# Patient Record
Sex: Female | Born: 1935 | Race: White | Hispanic: No | Marital: Married | Smoking: Never smoker
Health system: Southern US, Community
[De-identification: ages and names within clinical notes are randomized; demographics above are authoritative.]

---

## 2005-01-23 ENCOUNTER — Ambulatory Visit: Payer: Self-pay | Admitting: Internal Medicine

## 2005-04-11 ENCOUNTER — Ambulatory Visit: Payer: Self-pay | Admitting: Unknown Physician Specialty

## 2006-02-06 ENCOUNTER — Ambulatory Visit: Payer: Self-pay | Admitting: Internal Medicine

## 2006-02-13 ENCOUNTER — Ambulatory Visit: Payer: Self-pay | Admitting: Ophthalmology

## 2006-02-17 ENCOUNTER — Ambulatory Visit: Payer: Self-pay | Admitting: Ophthalmology

## 2006-05-08 ENCOUNTER — Emergency Department: Payer: Self-pay | Admitting: Emergency Medicine

## 2006-05-08 ENCOUNTER — Other Ambulatory Visit: Payer: Self-pay

## 2007-02-09 ENCOUNTER — Ambulatory Visit: Payer: Self-pay | Admitting: Internal Medicine

## 2008-02-24 ENCOUNTER — Ambulatory Visit: Payer: Self-pay | Admitting: Internal Medicine

## 2008-06-27 ENCOUNTER — Ambulatory Visit: Payer: Self-pay

## 2009-02-26 ENCOUNTER — Ambulatory Visit: Payer: Self-pay | Admitting: Internal Medicine

## 2009-12-10 ENCOUNTER — Ambulatory Visit: Payer: Self-pay | Admitting: Internal Medicine

## 2009-12-27 ENCOUNTER — Ambulatory Visit: Payer: Self-pay | Admitting: Internal Medicine

## 2010-01-10 ENCOUNTER — Ambulatory Visit: Payer: Self-pay | Admitting: Internal Medicine

## 2010-02-09 ENCOUNTER — Ambulatory Visit: Payer: Self-pay | Admitting: Internal Medicine

## 2010-03-05 ENCOUNTER — Ambulatory Visit: Payer: Self-pay | Admitting: Internal Medicine

## 2010-04-24 ENCOUNTER — Ambulatory Visit: Payer: Self-pay | Admitting: Unknown Physician Specialty

## 2010-04-26 LAB — PATHOLOGY REPORT

## 2010-07-16 ENCOUNTER — Ambulatory Visit: Payer: Self-pay | Admitting: Internal Medicine

## 2010-08-11 ENCOUNTER — Ambulatory Visit: Payer: Self-pay | Admitting: Internal Medicine

## 2011-01-07 ENCOUNTER — Ambulatory Visit: Payer: Self-pay | Admitting: Internal Medicine

## 2011-01-11 ENCOUNTER — Ambulatory Visit: Payer: Self-pay | Admitting: Internal Medicine

## 2011-02-10 ENCOUNTER — Ambulatory Visit: Payer: Self-pay | Admitting: Internal Medicine

## 2011-03-17 ENCOUNTER — Ambulatory Visit: Payer: Self-pay | Admitting: Family Medicine

## 2011-07-15 ENCOUNTER — Ambulatory Visit: Payer: Self-pay | Admitting: Internal Medicine

## 2011-07-15 LAB — CBC CANCER CENTER
Basophil %: 0.3 %
Eosinophil %: 0 %
HCT: 38.6 % (ref 35.0–47.0)
HGB: 13.1 g/dL (ref 12.0–16.0)
Lymphocyte #: 1.7 x10 3/mm (ref 1.0–3.6)
Lymphocyte %: 21.6 %
MCH: 30.7 pg (ref 26.0–34.0)
MCHC: 34.1 g/dL (ref 32.0–36.0)
MCV: 90 fL (ref 80–100)
Monocyte #: 0.3 x10 3/mm (ref 0.0–0.7)
Neutrophil #: 5.7 x10 3/mm (ref 1.4–6.5)
RBC: 4.28 10*6/uL (ref 3.80–5.20)
RDW: 13.5 % (ref 11.5–14.5)
WBC: 7.7 x10 3/mm (ref 3.6–11.0)

## 2011-07-15 LAB — CREATININE, SERUM
Creatinine: 1.16 mg/dL (ref 0.60–1.30)
EGFR (African American): 58 — ABNORMAL LOW

## 2011-07-15 LAB — CALCIUM: Calcium, Total: 9.1 mg/dL (ref 8.5–10.1)

## 2011-08-11 ENCOUNTER — Ambulatory Visit: Payer: Self-pay | Admitting: Internal Medicine

## 2011-08-14 ENCOUNTER — Ambulatory Visit: Payer: Self-pay

## 2011-09-29 IMAGING — CT CT CHEST W/ CM
1 series · 15 of 31 positions shown, 19 images · non-contrast
Comparison: none

REASON FOR EXAM: 4 ml ground glassn nodule left upper lobe FU from CT
January 2010
COMMENTS:

[Series 2: chest w/ 5.0 i41f 3 · axial · 0.63mm/px · z∈[-299,-24]mm · 15 of 61 slices shown, 19 images]
[im 3/61  mediastinal]
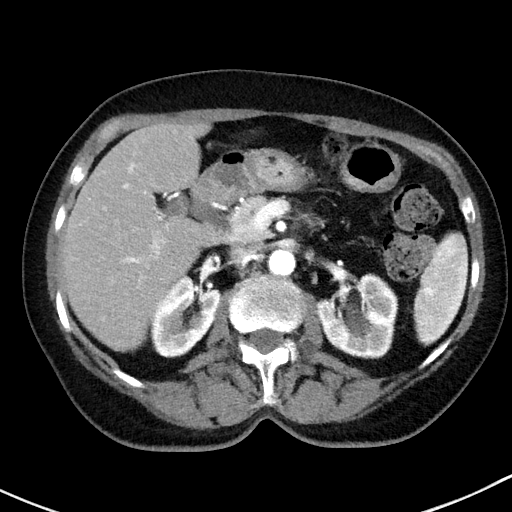
[im 3/61  lung]
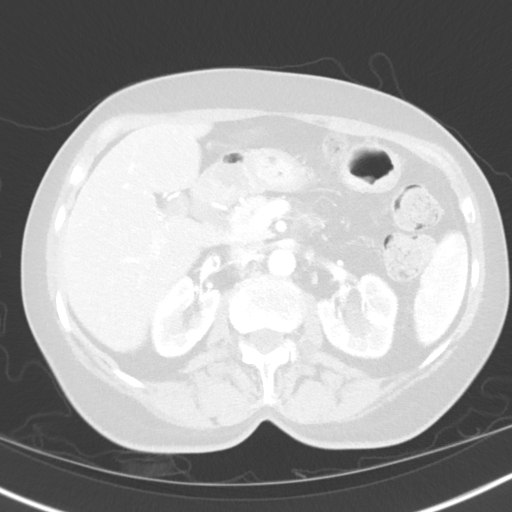
[im 7/61  lung]
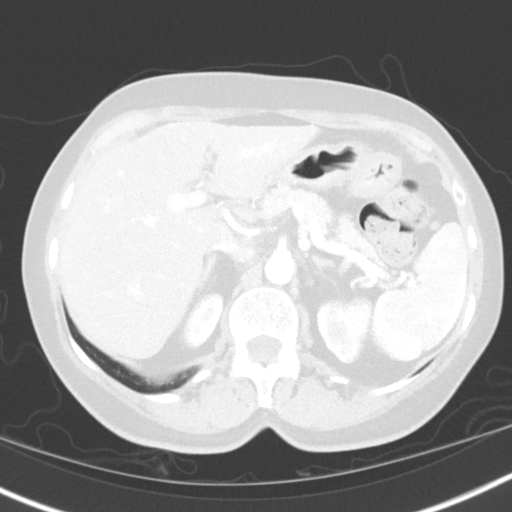
[im 12/61  lung]
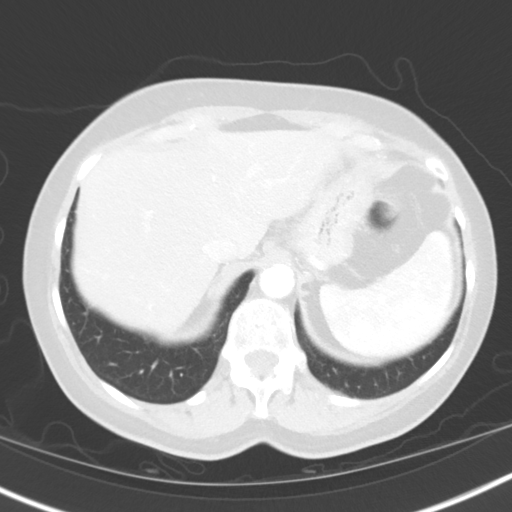
[im 14/61  lung]
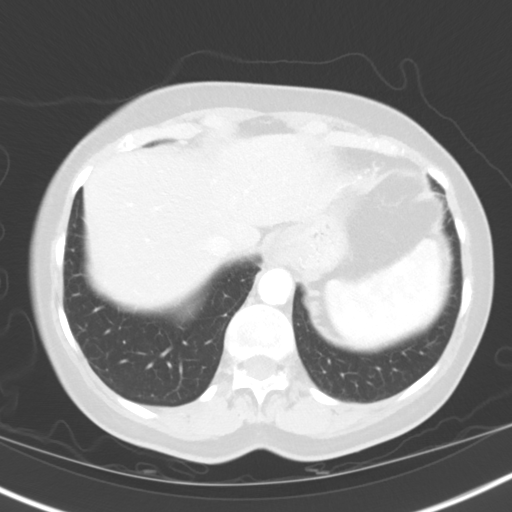
[im 18/61  mediastinal]
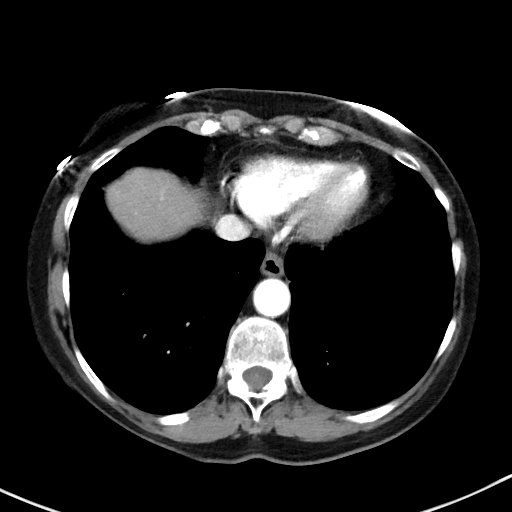
[im 18/61  lung]
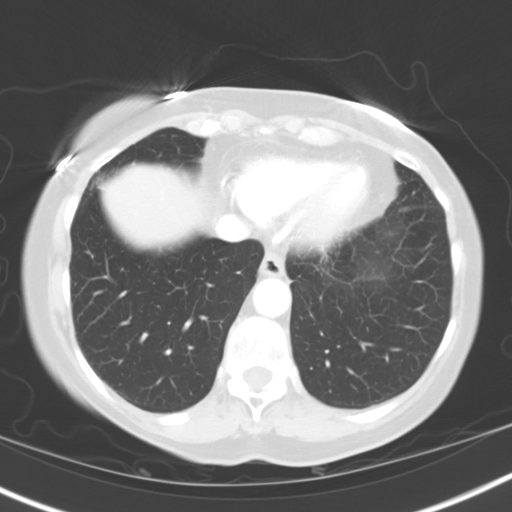
[im 23/61  lung]
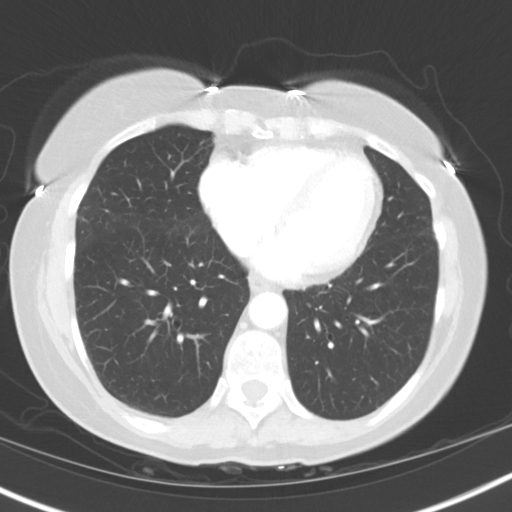
[im 27/61  lung]
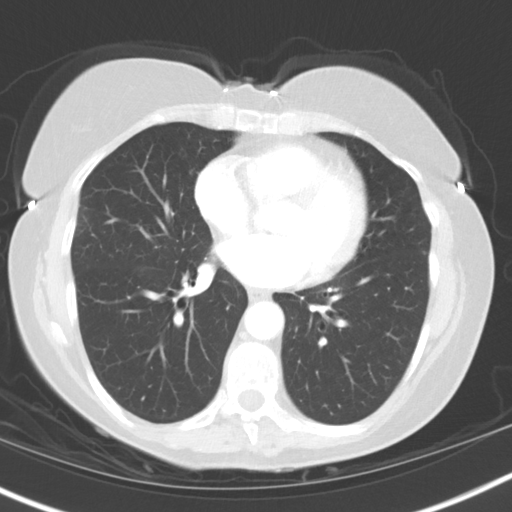
[im 32/61  lung]
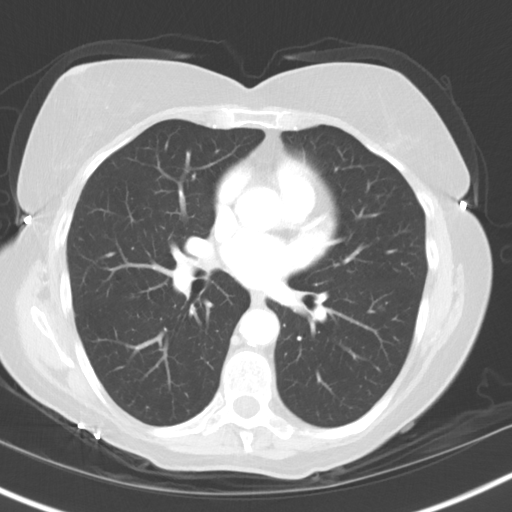
[im 34/61  mediastinal]
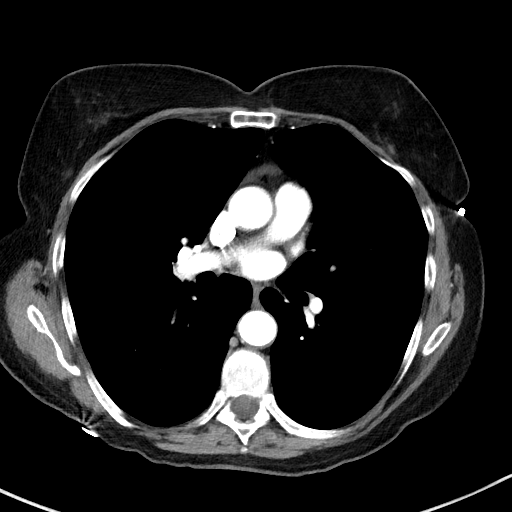
[im 34/61  lung]
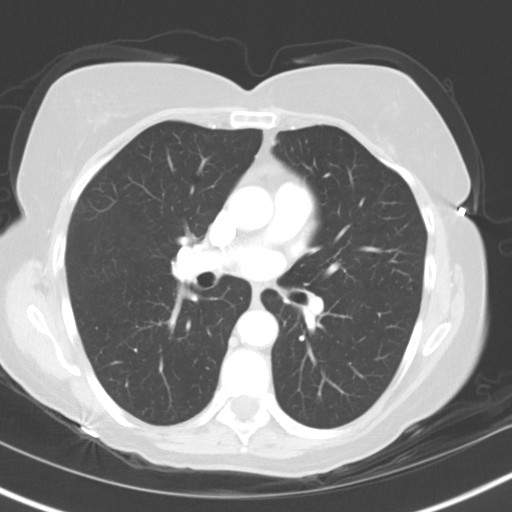
[im 37/61  lung]
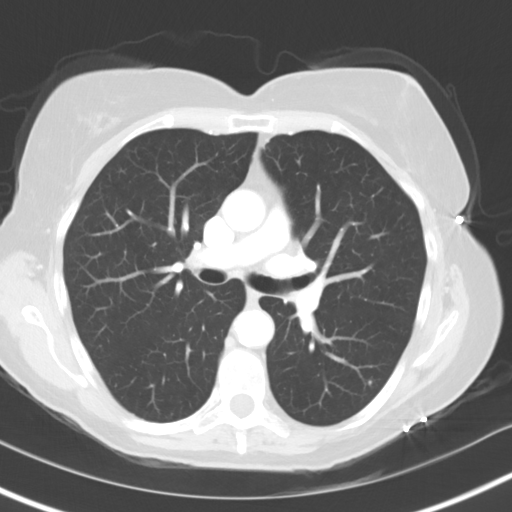
[im 41/61  lung]
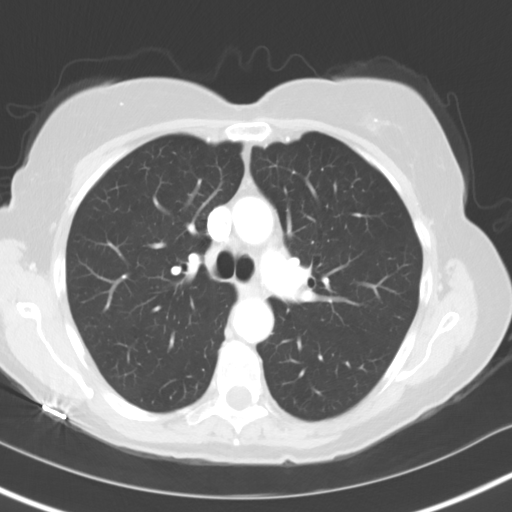
[im 45/61  lung]
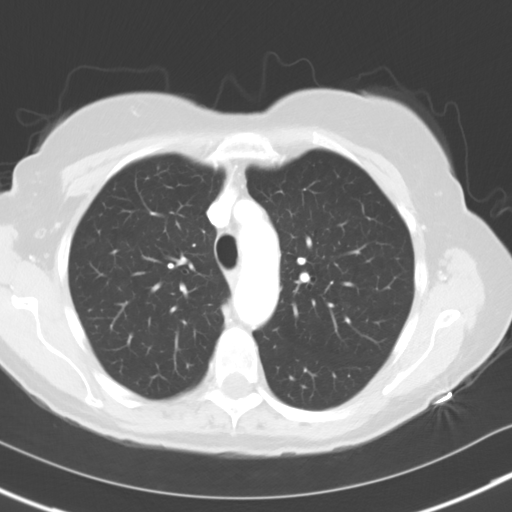
[im 49/61  mediastinal]
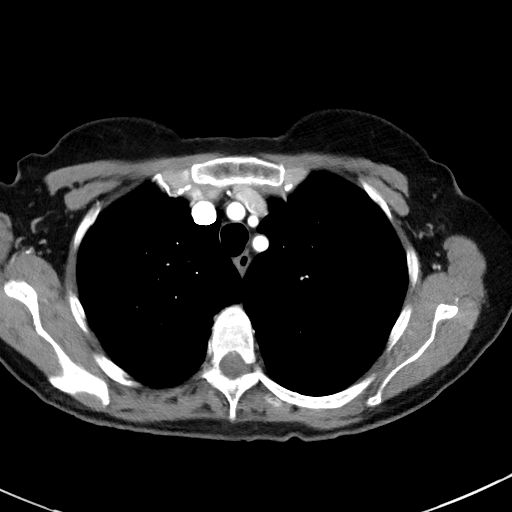
[im 49/61  lung]
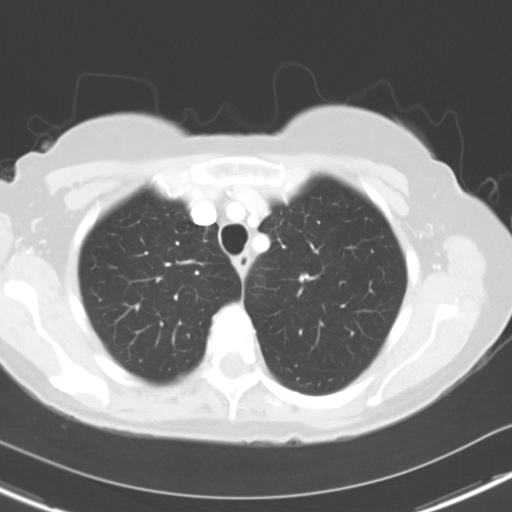
[im 54/61  lung]
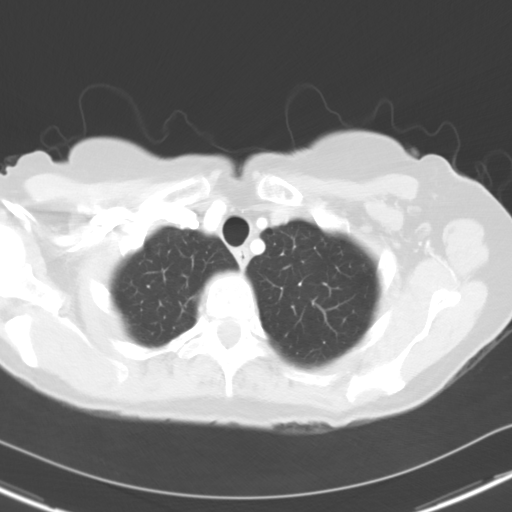
[im 58/61  lung]
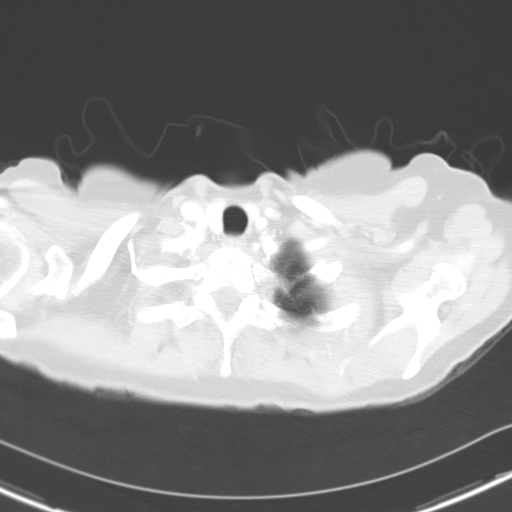

[15 of 31 positions shown; findings below may reference images not displayed]

PROCEDURE:     KCT - KCT CHEST WITH CONTRAST  - January 20, 2011 [DATE]

RESULT:     Axial CT scanning was performed through the chest at 5 mm
intervals and slice thicknesses following intravenous administration of 75
cc of [id number]. Comparison is made to the study 11 January, 2010.
Review of multiplanar reconstructed images was performed separately on the
VIA monitor.

At lung window settings I see no interstitial nor alveolar infiltrates.
There are stable appearing 2 to 3 mm diameter nodules in both lungs. These
are partially calcified. No groundglass density nodule is demonstrated in
either lung. There is no evidence of bronchiectasis.

At mediastinal window settings the cardiac chambers are normal in size. The
caliber of the thoracic aorta is normal. There is a small hiatal hernia. I
see no pathologic sized mediastinal or hilar or axillary lymph nodes. The
thyroid lobes normal in size there is likely a nodule posteriorly in the
right thyroid lobe. The thoracic vertebral bodies are preserved in height.
Within the upper abdomen the observed portions of the liver and spleen are
normal in appearance. I see no adrenal masses.
IMPRESSION: 1. I do not see acute pulmonary parenchymal abnormality. Specifically there
is no evidence of interstitial nor alveolar pneumonia nor other processes.
No abnormality in the right middle lobe is demonstrated. There are scattered
2 to 3 mm diameter partially calcified nodules consistent with previous
granulomatous infection. There is likely underlying COPD.
2. There is no evidence of CHF. I see no mediastinal nor hilar
lymphadenopathy. No acute thoracic aortic pathology is demonstrated.
3. There is no pleural nor pericardial effusion.

## 2012-01-16 ENCOUNTER — Ambulatory Visit: Payer: Self-pay | Admitting: Internal Medicine

## 2012-01-16 LAB — CBC CANCER CENTER
Basophil #: 0 x10 3/mm (ref 0.0–0.1)
Basophil %: 0.4 %
Eosinophil #: 0 x10 3/mm (ref 0.0–0.7)
Eosinophil %: 0 %
HCT: 36.4 % (ref 35.0–47.0)
HGB: 11.9 g/dL — ABNORMAL LOW (ref 12.0–16.0)
Lymphocyte #: 2.1 x10 3/mm (ref 1.0–3.6)
Lymphocyte %: 37.7 %
MCH: 29.5 pg (ref 26.0–34.0)
MCHC: 32.8 g/dL (ref 32.0–36.0)
MCV: 90 fL (ref 80–100)
Monocyte #: 0.4 x10 3/mm (ref 0.2–0.9)
Neutrophil #: 3.1 x10 3/mm (ref 1.4–6.5)
RDW: 13.4 % (ref 11.5–14.5)

## 2012-01-16 LAB — CREATININE, SERUM
EGFR (African American): 57 — ABNORMAL LOW
EGFR (Non-African Amer.): 49 — ABNORMAL LOW

## 2012-01-19 LAB — PROT IMMUNOELECTROPHORES(ARMC)

## 2012-02-10 ENCOUNTER — Ambulatory Visit: Payer: Self-pay | Admitting: Internal Medicine

## 2012-05-26 ENCOUNTER — Ambulatory Visit: Payer: Self-pay | Admitting: Family Medicine

## 2012-11-22 DIAGNOSIS — Z85828 Personal history of other malignant neoplasm of skin: Secondary | ICD-10-CM

## 2012-11-22 HISTORY — DX: Personal history of other malignant neoplasm of skin: Z85.828

## 2019-11-03 ENCOUNTER — Ambulatory Visit (INDEPENDENT_AMBULATORY_CARE_PROVIDER_SITE_OTHER): Payer: Medicare Other | Admitting: Dermatology

## 2019-11-03 ENCOUNTER — Other Ambulatory Visit: Payer: Self-pay

## 2019-11-03 ENCOUNTER — Encounter: Payer: Self-pay | Admitting: Dermatology

## 2019-11-03 DIAGNOSIS — R21 Rash and other nonspecific skin eruption: Secondary | ICD-10-CM | POA: Diagnosis not present

## 2019-11-03 DIAGNOSIS — L719 Rosacea, unspecified: Secondary | ICD-10-CM

## 2019-11-03 DIAGNOSIS — Z85828 Personal history of other malignant neoplasm of skin: Secondary | ICD-10-CM | POA: Diagnosis not present

## 2019-11-03 DIAGNOSIS — L609 Nail disorder, unspecified: Secondary | ICD-10-CM

## 2019-11-03 MED ORDER — FLUCONAZOLE 200 MG PO TABS: ORAL_TABLET | ORAL | 2 refills | Status: DC

## 2019-11-03 NOTE — Progress Notes (Signed)
   Follow-Up Visit   Subjective  Sara Robinson is a 86 y.o. female who presents for the following: irregular sensation (patient c/o soreness and swelling inside the nose and is concerned about recurrent basal cell carcinoma) and nail dystrophy (patient did use a topical solution in the past for about two months but can't recall the name of it. She has difficulty seeing and it is difficult to apply the solution to the nails).   The following portions of the chart were reviewed this encounter and updated as appropriate:  Tobacco  Allergies  Meds  Problems  Med Hx  Surg Hx  Fam Hx     Review of Systems:  No other skin or systemic complaints except as noted in HPI or Assessment and Plan.  Objective  Well appearing patient in no apparent distress; mood and affect are within normal limits.  A focused examination was performed including the face. Relevant physical exam findings are noted in the Assessment and Plan.  Objective  Nasal tip: Clear   Objective  R nose: The skin looks normal no evidence of any growth or cancer   Objective  Face: Pinkness of the face and pustule of the right nose  Objective  B/L toenails: Toenail dystrophy   Assessment & Plan    History of basal cell carcinoma (BCC) Nasal tip  Clear. Observe for recurrence. Call clinic for new or changing lesions.  Recommend regular skin exams, daily broad-spectrum spf 30+ sunscreen use, and photoprotection.     Rash R nose  Sensation of soreness and swelling on the inside of the nose without any evidence of skin abnormality -  Patient should follow up with ENT physician will refer to [facility], patient does not want to see Dr. Tami Ribas because they used to be neighbors. Patient reassured no evidence of skin cancer on today's visit.   Ambulatory referral to ENT - R nose  Rosacea Face  Continue Metrogel but increase use to BID   Nail problem - Tinea Unguium B/L toenails  No history of liver issues,  patient not on cholesterol medications.  Start Diflucan 200mg  po QW #4 2RF. Recommend OTC DermaNail daily   fluconazole (DIFLUCAN) 200 MG tablet - B/L toenails  Return in about 3 months (around 02/03/2020).  Luther Redo, CMA, am acting as scribe for Sarina Ser, MD .  Documentation: I have reviewed the above documentation for accuracy and completeness, and I agree with the above.  Sarina Ser, MD

## 2019-11-08 ENCOUNTER — Encounter: Payer: Self-pay | Admitting: Dermatology

## 2020-01-21 ENCOUNTER — Other Ambulatory Visit: Payer: Self-pay | Admitting: Dermatology

## 2020-01-21 DIAGNOSIS — L609 Nail disorder, unspecified: Secondary | ICD-10-CM

## 2020-02-08 ENCOUNTER — Ambulatory Visit: Payer: PRIVATE HEALTH INSURANCE | Admitting: Dermatology

## 2020-09-10 ENCOUNTER — Ambulatory Visit (INDEPENDENT_AMBULATORY_CARE_PROVIDER_SITE_OTHER): Payer: Medicare Other | Admitting: Dermatology

## 2020-09-10 ENCOUNTER — Other Ambulatory Visit: Payer: Self-pay

## 2020-09-10 DIAGNOSIS — L720 Epidermal cyst: Secondary | ICD-10-CM | POA: Diagnosis not present

## 2020-09-10 DIAGNOSIS — L578 Other skin changes due to chronic exposure to nonionizing radiation: Secondary | ICD-10-CM

## 2020-09-10 DIAGNOSIS — D1800 Hemangioma unspecified site: Secondary | ICD-10-CM

## 2020-09-10 DIAGNOSIS — L814 Other melanin hyperpigmentation: Secondary | ICD-10-CM

## 2020-09-10 DIAGNOSIS — L570 Actinic keratosis: Secondary | ICD-10-CM

## 2020-09-10 DIAGNOSIS — Z85828 Personal history of other malignant neoplasm of skin: Secondary | ICD-10-CM

## 2020-09-10 DIAGNOSIS — Z1283 Encounter for screening for malignant neoplasm of skin: Secondary | ICD-10-CM | POA: Diagnosis not present

## 2020-09-10 DIAGNOSIS — D229 Melanocytic nevi, unspecified: Secondary | ICD-10-CM

## 2020-09-10 DIAGNOSIS — L821 Other seborrheic keratosis: Secondary | ICD-10-CM

## 2020-09-10 NOTE — Progress Notes (Signed)
   Follow-Up Visit   Subjective  Sara Robinson is a 87 y.o. female who presents for the following: Follow-up (Patient is here today for follow up. She had missed last appointment in September of 2021 and had not been seen since June of last year. She reports having concerns today with a spot on right cheek/nose area. She denies other concerns. She has history of bcc on multiple sites. ).  Patient's daughter reports they called office and was told to follow up with Dr. Lacinda Axon concerning area at left zygoma anterior sideburn that was biopsied in 2018. Patient followed up with Dr. Lacinda Axon and biopsy was proven bcc. Patient is scheduled for removal with Dr. Lacinda Axon in the next few weeks.   The following portions of the chart were reviewed this encounter and updated as appropriate:  Tobacco  Allergies  Meds  Problems  Med Hx  Surg Hx  Fam Hx      Objective  Well appearing patient in no apparent distress; mood and affect are within normal limits.  A focused examination was performed including face, neck, scalp, bilateral ears, back, arms. Relevant physical exam findings are noted in the Assessment and Plan.  Objective  Right Malar Cheek x1: Erythematous thin papules/macules with gritty scale.   Objective  right lateral nose: Smooth white papule(s).   Assessment & Plan  Actinic keratosis Right Malar Cheek x1 Prior to procedure, discussed risks of blister formation, small wound, skin dyspigmentation, or rare scar following cryotherapy.   Destruction of lesion - Right Malar Cheek x1 Complexity: simple   Destruction method: cryotherapy   Informed consent: discussed and consent obtained   Timeout:  patient name, date of birth, surgical site, and procedure verified Lesion destroyed using liquid nitrogen: Yes   Region frozen until ice ball extended beyond lesion: Yes   Outcome: patient tolerated procedure well with no complications   Post-procedure details: wound care instructions given     Milia right lateral nose Benign, observe.   Lentigines - Scattered tan macules - Due to sun exposure - Benign-appering, observe - Recommend daily broad spectrum sunscreen SPF 30+ to sun-exposed areas, reapply every 2 hours as needed. - Call for any changes  Seborrheic Keratoses - Stuck-on, waxy, tan-brown papules and/or plaques  - Benign-appearing - Discussed benign etiology and prognosis. - Observe - Call for any changes  Melanocytic Nevi - Tan-brown and/or pink-flesh-colored symmetric macules and papules - Benign appearing on exam today - Observation - Call clinic for new or changing moles - Recommend daily use of broad spectrum spf 30+ sunscreen to sun-exposed areas.   Hemangiomas - Red papules - Discussed benign nature - Observe - Call for any changes  Actinic Damage - Chronic condition, secondary to cumulative UV/sun exposure - diffuse scaly erythematous macules with underlying dyspigmentation - Recommend daily broad spectrum sunscreen SPF 30+ to sun-exposed areas, reapply every 2 hours as needed.  - Staying in the shade or wearing long sleeves, sun glasses (UVA+UVB protection) and wide brim hats (4-inch brim around the entire circumference of the hat) are also recommended for sun protection.  - Call for new or changing lesions.  Skin cancer screening performed today.  Return in about 6 months (around 03/13/2021) for tbse .  IRuthell Rummage, CMA, am acting as scribe for Sarina Ser, MD.  Documentation: I have reviewed the above documentation for accuracy and completeness, and I agree with the above.  Sarina Ser, MD

## 2020-09-10 NOTE — Patient Instructions (Addendum)
Actinic keratoses are precancerous spots that appear secondary to cumulative UV radiation exposure/sun exposure over time. They are chronic with expected duration over 1 year. A portion of actinic keratoses will progress to squamous cell carcinoma of the skin. It is not possible to reliably predict which spots will progress to skin cancer and so treatment is recommended to prevent development of skin cancer.  Recommend daily broad spectrum sunscreen SPF 30+ to sun-exposed areas, reapply every 2 hours as needed.  Recommend staying in the shade or wearing long sleeves, sun glasses (UVA+UVB protection) and wide brim hats (4-inch brim around the entire circumference of the hat). Call for new or changing lesions.   Cryotherapy Aftercare  Wash gently with soap and water everyday.   Apply Vaseline and Band-Aid daily until healed.   If you have any questions or concerns for your doctor, please call our main line at [phone] and press option 4 to reach your doctor's medical assistant. If no one answers, please leave a voicemail as directed and we will return your call as soon as possible. Messages left after 4 pm will be answered the following business day.   You may also send us a message via MyChart. We typically respond to MyChart messages within 1-2 business days.  For prescription refills, please ask your pharmacy to contact our office. Our fax number is [phone].  If you have an urgent issue when the clinic is closed that cannot wait until the next business day, you can page your doctor at the number below.    Please note that while we do our best to be available for urgent issues outside of office hours, we are not available 24/7.   If you have an urgent issue and are unable to reach us, you may choose to seek medical care at your doctor's office, retail clinic, urgent care center, or emergency room.  If you have a medical emergency, please immediately call 911 or go to the emergency  department.  Pager Numbers  - Dr. Kowalski: [phone]  - Dr. Moye: [phone]  - Dr. Stewart: [phone]  In the event of inclement weather, please call our main line at [phone] for an update on the status of any delays or closures.  Dermatology Medication Tips: Please keep the boxes that topical medications come in in order to help keep track of the instructions about where and how to use these. Pharmacies typically print the medication instructions only on the boxes and not directly on the medication tubes.   If your medication is too expensive, please contact our office at [phone] option 4 or send us a message through MyChart.   We are unable to tell what your co-pay for medications will be in advance as this is different depending on your insurance coverage. However, we may be able to find a substitute medication at lower cost or fill out paperwork to get insurance to cover a needed medication.   If a prior authorization is required to get your medication covered by your insurance company, please allow us 1-2 business days to complete this process.  Drug prices often vary depending on where the prescription is filled and some pharmacies may offer cheaper prices.  The website www.goodrx.com contains coupons for medications through different pharmacies. The prices here do not account for what the cost may be with help from insurance (it may be cheaper with your insurance), but the website can give you the price if you did not use any insurance.  - You   can print the associated coupon and take it with your prescription to the pharmacy.  - You may also stop by our office during regular business hours and pick up a GoodRx coupon card.  - If you need your prescription sent electronically to a different pharmacy, notify our office through [facility] MyChart or by phone at [phone] option 4.  

## 2020-09-11 ENCOUNTER — Encounter: Payer: Self-pay | Admitting: Dermatology

## 2021-03-14 ENCOUNTER — Other Ambulatory Visit: Payer: Self-pay

## 2021-03-14 ENCOUNTER — Ambulatory Visit (INDEPENDENT_AMBULATORY_CARE_PROVIDER_SITE_OTHER): Payer: Medicare Other | Admitting: Dermatology

## 2021-03-14 DIAGNOSIS — L814 Other melanin hyperpigmentation: Secondary | ICD-10-CM

## 2021-03-14 DIAGNOSIS — L820 Inflamed seborrheic keratosis: Secondary | ICD-10-CM

## 2021-03-14 DIAGNOSIS — Z1283 Encounter for screening for malignant neoplasm of skin: Secondary | ICD-10-CM | POA: Diagnosis not present

## 2021-03-14 DIAGNOSIS — Z85828 Personal history of other malignant neoplasm of skin: Secondary | ICD-10-CM

## 2021-03-14 DIAGNOSIS — H029 Unspecified disorder of eyelid: Secondary | ICD-10-CM

## 2021-03-14 DIAGNOSIS — L570 Actinic keratosis: Secondary | ICD-10-CM | POA: Diagnosis not present

## 2021-03-14 DIAGNOSIS — D1800 Hemangioma unspecified site: Secondary | ICD-10-CM

## 2021-03-14 DIAGNOSIS — L578 Other skin changes due to chronic exposure to nonionizing radiation: Secondary | ICD-10-CM

## 2021-03-14 DIAGNOSIS — D229 Melanocytic nevi, unspecified: Secondary | ICD-10-CM

## 2021-03-14 DIAGNOSIS — L821 Other seborrheic keratosis: Secondary | ICD-10-CM

## 2021-03-14 NOTE — Progress Notes (Signed)
Follow-Up Visit   Subjective  Sara Robinson is a 87 y.o. female who presents for the following: Annual Exam (History of BCC - TBSE today). Accompanied by daughter, Mechele Claude  The following portions of the chart were reviewed this encounter and updated as appropriate:   Tobacco  Allergies  Meds  Problems  Med Hx  Surg Hx  Fam Hx     Review of Systems:  No other skin or systemic complaints except as noted in HPI or Assessment and Plan.  Objective  Well appearing patient in no apparent distress; mood and affect are within normal limits.  A full examination was performed including scalp, head, eyes, ears, nose, lips, neck, chest, axillae, abdomen, back, buttocks, bilateral upper extremities, bilateral lower extremities, hands, feet, fingers, toes, fingernails, and toenails. All findings within normal limits unless otherwise noted below.  Left medial lower eyelid margin x 1, right temple x 1 Erythematous thin papules/macules with gritty scale.   Right cheek x 1 Erythematous keratotic or waxy stuck-on papule or plaque.    Assessment & Plan   Lentigines - Scattered tan macules - Due to sun exposure - Benign-appearing, observe - Recommend daily broad spectrum sunscreen SPF 30+ to sun-exposed areas, reapply every 2 hours as needed. - Call for any changes  Seborrheic Keratoses - Stuck-on, waxy, tan-brown papules and/or plaques  - Benign-appearing - Discussed benign etiology and prognosis. - Observe - Call for any changes -Advised patient fee for LN2 is $60 for first lesion and $15 each additional.  Melanocytic Nevi - Tan-brown and/or pink-flesh-colored symmetric macules and papules - Benign appearing on exam today - Observation - Call clinic for new or changing moles - Recommend daily use of broad spectrum spf 30+ sunscreen to sun-exposed areas.   Hemangiomas - Red papules - Discussed benign nature - Observe - Call for any changes  Actinic Damage - Chronic  condition, secondary to cumulative UV/sun exposure - diffuse scaly erythematous macules with underlying dyspigmentation - Recommend daily broad spectrum sunscreen SPF 30+ to sun-exposed areas, reapply every 2 hours as needed.  - Staying in the shade or wearing long sleeves, sun glasses (UVA+UVB protection) and wide brim hats (4-inch brim around the entire circumference of the hat) are also recommended for sun protection.  - Call for new or changing lesions.  Skin cancer screening performed today.  AK (actinic keratosis) Left medial lower eyelid margin x 1, right temple x 1  Destruction of lesion - Left medial lower eyelid margin x 1, right temple x 1 Complexity: simple   Destruction method: cryotherapy   Informed consent: discussed and consent obtained   Timeout:  patient name, date of birth, surgical site, and procedure verified Lesion destroyed using liquid nitrogen: Yes   Region frozen until ice ball extended beyond lesion: Yes   Outcome: patient tolerated procedure well with no complications   Post-procedure details: wound care instructions given    Inflamed seborrheic keratosis Right cheek x 1  Destruction of lesion - Right cheek x 1 Complexity: simple   Destruction method: cryotherapy   Informed consent: discussed and consent obtained   Timeout:  patient name, date of birth, surgical site, and procedure verified Lesion destroyed using liquid nitrogen: Yes   Region frozen until ice ball extended beyond lesion: Yes   Outcome: patient tolerated procedure well with no complications   Post-procedure details: wound care instructions given    Skin cancer screening  Return in about 6 months (around 09/11/2021) for sun exposed areas.  I,  Ashok Cordia, CMA, am acting as scribe for Sarina Ser, MD . Documentation: I have reviewed the above documentation for accuracy and completeness, and I agree with the above.  Sarina Ser, MD

## 2021-03-14 NOTE — Patient Instructions (Signed)

## 2021-03-17 ENCOUNTER — Encounter: Payer: Self-pay | Admitting: Dermatology

## 2021-09-12 ENCOUNTER — Ambulatory Visit: Payer: Medicare Other | Admitting: Dermatology
